# Patient Record
Sex: Female | Born: 1975 | Race: White | Hispanic: No | Marital: Married | Smoking: Light tobacco smoker
Health system: Southern US, Community
[De-identification: ages and names within clinical notes are randomized; demographics above are authoritative.]

## PROBLEM LIST (undated history)

## (undated) DIAGNOSIS — G4730 Sleep apnea, unspecified: Secondary | ICD-10-CM

## (undated) DIAGNOSIS — F329 Major depressive disorder, single episode, unspecified: Secondary | ICD-10-CM

## (undated) DIAGNOSIS — F419 Anxiety disorder, unspecified: Secondary | ICD-10-CM

## (undated) DIAGNOSIS — N979 Female infertility, unspecified: Secondary | ICD-10-CM

## (undated) DIAGNOSIS — N939 Abnormal uterine and vaginal bleeding, unspecified: Secondary | ICD-10-CM

## (undated) DIAGNOSIS — F32A Depression, unspecified: Secondary | ICD-10-CM

## (undated) HISTORY — DX: Female infertility, unspecified: N97.9

## (undated) HISTORY — PX: CARPAL TUNNEL RELEASE: SHX101

## (undated) HISTORY — DX: Depression, unspecified: F32.A

## (undated) HISTORY — PX: CHOLECYSTECTOMY: SHX55

## (undated) HISTORY — DX: Sleep apnea, unspecified: G47.30

## (undated) HISTORY — DX: Major depressive disorder, single episode, unspecified: F32.9

## (undated) HISTORY — DX: Abnormal uterine and vaginal bleeding, unspecified: N93.9

## (undated) HISTORY — DX: Anxiety disorder, unspecified: F41.9

---

## 2004-08-24 HISTORY — PX: CERVICAL DISC SURGERY: SHX588

## 2004-08-24 HISTORY — PX: LUMBAR DISC SURGERY: SHX700

## 2016-01-22 ENCOUNTER — Ambulatory Visit: Payer: Self-pay | Admitting: Obstetrics and Gynecology

## 2016-01-23 ENCOUNTER — Ambulatory Visit: Payer: Self-pay | Admitting: Obstetrics and Gynecology

## 2016-02-06 ENCOUNTER — Encounter: Payer: Self-pay | Admitting: Obstetrics and Gynecology

## 2016-02-06 ENCOUNTER — Ambulatory Visit (INDEPENDENT_AMBULATORY_CARE_PROVIDER_SITE_OTHER): Payer: BLUE CROSS/BLUE SHIELD | Admitting: Obstetrics and Gynecology

## 2016-02-06 VITALS — BP 130/72 | HR 60 | Resp 14 | Ht 66.5 in | Wt 345.0 lb

## 2016-02-06 DIAGNOSIS — N920 Excessive and frequent menstruation with regular cycle: Secondary | ICD-10-CM | POA: Diagnosis not present

## 2016-02-06 DIAGNOSIS — Z124 Encounter for screening for malignant neoplasm of cervix: Secondary | ICD-10-CM | POA: Diagnosis not present

## 2016-02-06 DIAGNOSIS — N926 Irregular menstruation, unspecified: Secondary | ICD-10-CM

## 2016-02-06 LAB — TSH: TSH: 1.41 m[IU]/L

## 2016-02-06 LAB — CBC
HCT: 41.4 % (ref 35.0–45.0)
HEMOGLOBIN: 13.8 g/dL (ref 11.7–15.5)
MCH: 29.5 pg (ref 27.0–33.0)
MCHC: 33.3 g/dL (ref 32.0–36.0)
MCV: 88.5 fL (ref 80.0–100.0)
MPV: 9.7 fL (ref 7.5–12.5)
Platelets: 192 10*3/uL (ref 140–400)
RBC: 4.68 MIL/uL (ref 3.80–5.10)
RDW: 14 % (ref 11.0–15.0)
WBC: 10 10*3/uL (ref 3.8–10.8)

## 2016-02-06 NOTE — Progress Notes (Signed)
Patient ID: Leodis SiasRhonda Reasner, female   DOB: 1976/03/19, 40 y.o.   MRN: [id number] GYNECOLOGY  VISIT  Ref: Samuel Jesterynthia Butler, MD   HPI: 40 y.o.   Married  Caucasian  female   G0P0000 with Patient's last menstrual period was 01/02/2016 (exact date).   here for abnormal uterine bleeding. Patient states has never had regular menses.  She went 9 years without a cycle from age [age].  Usually had 1-2 cycles per year. The past 2 years has had fairly regular cycles which have been heavy and passes large "half dollar" size clots.  Patient states cycle last month extremely heavy and passed large clots.  Mother present in the room for the entire visit.   Menses occur every month.  Lasted 7 - 14 days.  Unable to use tampons easily due to rods in her back.  States someone else has to place them for her. Clots go around the tampons also. Significant cramping.   Given Premarin orally to help reduce the bleeding.  Reports that this is too expensive for her.   Losing hair.   Not sexually active due to pain.   Had chronic back pain.  Smokes marijuana for muscle spasms and relief.  Smoker - 5 cig per day.   States a neighbor is stalking her.   GYNECOLOGIC HISTORY: Patient's last menstrual period was 01/02/2016 (exact date). Contraception:  none Menopausal hormone therapy:  n/a Last mammogram:  NEVER Last pap smear:   2013 normal per patient        OB History    Gravida Para Term Preterm AB TAB SAB Ectopic Multiple Living   0 0 0 0 0 0 0 0 0 0          There are no active problems to display for this patient.   Past Medical History  Diagnosis Date  . Abnormal uterine bleeding   . Anxiety   . Depression   . Infertility, female     Past Surgical History  Procedure Laterality Date  . Cholecystectomy    . Cervical disc surgery  2006    --3 broken discs--[facility]  . Lumbar disc surgery  2006  . Carpal tunnel release Right     Current Outpatient Prescriptions  Medication Sig Dispense  Refill  . ALPRAZolam (XANAX) 1 MG tablet Take 1 tablet by mouth 4 (four) times daily.  2  . fentaNYL (DURAGESIC - DOSED MCG/HR) 75 MCG/HR Place 1 patch onto the skin every 3 (three) days.  0  . gabapentin (NEURONTIN) 300 MG capsule Take 1 capsule by mouth 3 (three) times daily.  10  . HYDROmorphone (DILAUDID) 8 MG tablet Takes 1-2 tablets at bedtime  0  . oxycodone (ROXICODONE) 30 MG immediate release tablet Take 1 tablet by mouth every 4 (four) hours.    Marland Kitchen. PREMARIN 1.25 MG tablet Take 1 tablet by mouth daily.  10  . promethazine (PHENERGAN) 25 MG tablet Take 1 tablet by mouth 3 (three) times daily.  3  . sertraline (ZOLOFT) 100 MG tablet Take 1 tablet by mouth daily.  10   No current facility-administered medications for this visit.     ALLERGIES: Amoxicillin  Family History  Problem Relation Age of Onset  . Thyroid disease Mother     Social History   Social History  . Marital Status: Married    Spouse Name: N/A  . Number of Children: N/A  . Years of Education: N/A   Occupational History  . Not on file.  Social History Main Topics  . Smoking status: Light Tobacco Smoker    Types: Cigarettes  . Smokeless tobacco: Not on file     Comment: smokes 5 cigarettes/day  . Alcohol Use: No  . Drug Use: Yes    Special: Marijuana     Comment: nightly  . Sexual Activity:    Partners: Male    Pharmacist, hospital Protection: None   Other Topics Concern  . Not on file   Social History Narrative  . No narrative on file    ROS:  Pertinent items are noted in HPI.  PHYSICAL EXAMINATION:    BP 130/72 mmHg  Pulse 60  Resp 14  Ht 5' 6.5" (1.689 m)  Wt 345 lb (156.491 kg)  BMI 54.86 kg/m2  LMP 01/02/2016 (Exact Date)    General appearance: alert, cooperative and appears stated age.  Emotional and crying at times during the visit.  Difficulty using the exam table. Head: Normocephalic, without obvious abnormality, atraumatic Neck: no adenopathy, supple, symmetrical, trachea midline  and thyroid normal to inspection and palpation Lungs: clear to auscultation bilaterally Breasts: normal appearance, no masses or tenderness, Inspection negative, No nipple retraction or dimpling, No nipple discharge or bleeding, No axillary or supraclavicular adenopathy Heart: regular rate and rhythm Abdomen: obese, soft, non-tender, no masses,  no organomegaly Extremities: extremities normal, atraumatic, no cyanosis or edema Skin: Skin color, texture, turgor normal. No rashes or lesions Lymph nodes: Cervical, supraclavicular, and axillary nodes normal. No abnormal inguinal nodes palpated Neurologic: Grossly normal  Pelvic: External genitalia:  no lesions              Urethra:  normal appearing urethra with no masses, tenderness or lesions              Bartholins and Skenes: normal                 Vagina: normal appearing vagina with normal color and discharge, no lesions              Cervix: no lesions and no bleeding.              Pap taken: Yes.   Bimanual Exam:  Uterus:  normal size, contour, position, consistency, mobility, non-tender.  Exam limited by body habitus.               Adnexa: normal adnexa and no mass, fullness, tenderness              Rectal exam: Yes.  .  Confirms.              Anus:  normal sphincter tone, no lesions  Chaperone was present for exam.  ASSESSMENT  Menorrhagia with irregular menses.  Unopposed estrogen.  Recent treatment. Not using contraception.  Not sexually active. Morbid obesity.  Tobacco and marijuana use. Status post cervical and lumbar spine surgery.  Chronic pain.  Limited mobility. Depression and anxiety.  PLAN  Discussion of abnormal uterine bleeding as possible etiologies.  Anovulation.  Polyps. Fibroids. Hyperplasia.  Cancer. Pap and HR HPV taken.  Check TSH, CBC, and iron level.  Will have patient return for transvaginal ultraound and EMB. I discussed possible tx options for the bleeding including Micronor and Mirena IUD. I told  her I do not plan for her to continue with the unopposed estrogen therapy.  Needs mammogram.  [facility]  An After Visit Summary was printed and given to the patient.  __45____  minutes face to face time of which over 50% was spent in counseling.

## 2016-02-07 ENCOUNTER — Telehealth: Payer: Self-pay | Admitting: Obstetrics and Gynecology

## 2016-02-07 LAB — IRON: IRON: 63 ug/dL (ref 40–190)

## 2016-02-07 NOTE — Telephone Encounter (Signed)
Patient is returning a call to [name]. Patient asked that you call her home call [phone] and leave a message.  Patient does not get good phone reception, if you leave a message she will call you right back.

## 2016-02-07 NOTE — Telephone Encounter (Signed)
Left detailed message at number provided [phone], okay per ROI. Advised I have reviewed her medication list and verified this with up to date drug interaction. It is safe for her to take Ibuprofen 600-800 mg 1 hours before her appointment. This is will cause a drug interaction with the medications she is on. Advised she will need to take this with food. Return call with any further questions.  Routing to provider for final review. Patient agreeable to disposition. Will close encounter.

## 2016-02-07 NOTE — Telephone Encounter (Signed)
Patient returned call. Spoke with patient at length regarding all benefits/concerns. Agreement reached.   Patient requested instructions for procedures. Notified of need to hydrate and eat. Patient understood and agreeable. Patient requested information on ability to take ibuprofen based on her personal medication list. Patient requests call from nurse and authorizes detailed message left on her phone due to poor reception in her area. [phone].

## 2016-02-07 NOTE — Telephone Encounter (Signed)
Call patient to discuss benefits for a procedure. Left message requesting a return call.

## 2016-02-11 LAB — IPS PAP TEST WITH HPV

## 2016-02-13 ENCOUNTER — Ambulatory Visit (INDEPENDENT_AMBULATORY_CARE_PROVIDER_SITE_OTHER): Payer: BLUE CROSS/BLUE SHIELD | Admitting: Obstetrics and Gynecology

## 2016-02-13 ENCOUNTER — Ambulatory Visit (INDEPENDENT_AMBULATORY_CARE_PROVIDER_SITE_OTHER): Payer: BLUE CROSS/BLUE SHIELD

## 2016-02-13 ENCOUNTER — Encounter: Payer: Self-pay | Admitting: Obstetrics and Gynecology

## 2016-02-13 DIAGNOSIS — N921 Excessive and frequent menstruation with irregular cycle: Secondary | ICD-10-CM | POA: Diagnosis not present

## 2016-02-13 DIAGNOSIS — N926 Irregular menstruation, unspecified: Secondary | ICD-10-CM

## 2016-02-13 DIAGNOSIS — N920 Excessive and frequent menstruation with regular cycle: Secondary | ICD-10-CM

## 2016-02-13 NOTE — Progress Notes (Signed)
GYNECOLOGY  VISIT   HPI: 40 y.o.   Married  Caucasian  female   G0P0000 with Patient's last menstrual period was 01/02/2016 (exact date).   here for   Menorrhagia.  Mother is present for the entire visit today.  States she really wants her vaginal bleeding to stop.  She states she is making a significant investment in her care, and she needs to bleeding to stop.  She needs to be able to focus on her back and potential surgical care for her back and pain.   She stopped taking the oral Premarin after her visit with me on 5/1/1/7.  GYNECOLOGIC HISTORY: Patient's last menstrual period was 01/02/2016 (exact date). Contraception:  None.  Last intercourse was Dec 29, 2015.   Menopausal hormone therapy:  NA Last mammogram:   Never. Last pap smear:   02/07/16 - negative and negative HR HPV.        OB History    Gravida Para Term Preterm AB TAB SAB Ectopic Multiple Living   0 0 0 0 0 0 0 0 0 0          There are no active problems to display for this patient.   Past Medical History  Diagnosis Date  . Abnormal uterine bleeding   . Anxiety   . Depression   . Infertility, female     Past Surgical History  Procedure Laterality Date  . Cholecystectomy    . Cervical disc surgery  2006    --3 broken discs--[facility]  . Lumbar disc surgery  2006  . Carpal tunnel release Right     Current Outpatient Prescriptions  Medication Sig Dispense Refill  . ALPRAZolam (XANAX) 1 MG tablet Take 1 tablet by mouth 4 (four) times daily.  2  . fentaNYL (DURAGESIC - DOSED MCG/HR) 75 MCG/HR Place 1 patch onto the skin every 3 (three) days.  0  . gabapentin (NEURONTIN) 300 MG capsule Take 1 capsule by mouth 3 (three) times daily.  10  . HYDROmorphone (DILAUDID) 8 MG tablet Takes 1-2 tablets at bedtime  0  . oxycodone (ROXICODONE) 30 MG immediate release tablet Take 1 tablet by mouth every 4 (four) hours.    Marland Kitchen. PREMARIN 1.25 MG tablet Take 1 tablet by mouth daily.  10  . promethazine (PHENERGAN) 25 MG  tablet Take 1 tablet by mouth 3 (three) times daily.  3  . sertraline (ZOLOFT) 100 MG tablet Take 1 tablet by mouth daily.  10   No current facility-administered medications for this visit.     ALLERGIES: Amoxicillin  Family History  Problem Relation Age of Onset  . Thyroid disease Mother     Social History   Social History  . Marital Status: Married    Spouse Name: N/A  . Number of Children: N/A  . Years of Education: N/A   Occupational History  . Not on file.   Social History Main Topics  . Smoking status: Light Tobacco Smoker    Types: Cigarettes  . Smokeless tobacco: Not on file     Comment: smokes 5 cigarettes/day  . Alcohol Use: No  . Drug Use: Yes    Special: Marijuana     Comment: nightly  . Sexual Activity:    Partners: Male    Pharmacist, hospitalBirth Control/ Protection: None   Other Topics Concern  . Not on file   Social History Narrative    ROS:  Pertinent items are noted in HPI.  PHYSICAL EXAMINATION:    BP 110/74 mmHg  Pulse 64  Ht 5' 6.5" (1.689 m)  Wt 355 lb (161.027 kg)  BMI 56.45 kg/m2  LMP 01/02/2016 (Exact Date)    General appearance: alert, cooperative and appears stated age  Procedure - endometrial biopsy Consent performed. Speculum place in vagina.  Sterile prep of cervix with Hibiclens. Tenaculum to anterior cervical lip. Paracervical block with 10 cc 1% lidocaine _______ yes___.  Lot [id number], expiration 10/22/16. Pipelle placed to     9    cm without difficulty twice. Tissue obtained and sent to pathology. Speculum removed.  No complications. Minimal EBL.  Chaperone was present for exam.  ASSESSMENT  Menorrhagia with irregular menses.  Unopposed estrogen. Recent treatment.  Morbid obesity.  Tobacco and marijuana use. Status post cervical and lumbar spine surgery. Chronic pain. Limited mobility. Depression and anxiety.  PLAN  Discussion of abnormal uterine bleeding.  Follow up EMB. Precautions given.  We spent a significant  amount of time reviewing options to care, and I focused on the Mirena IUD.  We reviewed risks and benefits in detail.  Brochure provided. She understands that with the Mirena she may have irregular cycles for several months, but that she is likely to have very light menses or possible amenorrhea with this option. Micronor is an alternative as well.  Final plan to follow after EMB is back.   An After Visit Summary was printed and given to the patient.  __25____ minutes face to face time of which over 50% was spent in counseling.

## 2016-02-13 NOTE — Patient Instructions (Signed)

## 2016-02-17 LAB — IPS OTHER TISSUE BIOPSY

## 2016-02-27 ENCOUNTER — Telehealth: Payer: Self-pay | Admitting: *Deleted

## 2016-02-27 NOTE — Telephone Encounter (Signed)
Patient returned call. Reviewed results and options as directed by Dr Edward JollySilva.  Patient insistent that she wants bleeding to stop. Concerned she will continue to have bleeding with Mirena. Advised she can have consult with Dr Adolphus BirchwoodEmma Rossi to discuss possible hysterectomy. Patient states she would like to consider this but for now, will plan to proceed with hysteroscopy/Myosure/D&C/Mirena insertion. Patient also request phone call to mother Erskine Squibbjane, to review these results.  Call to mother, left message to call back.

## 2016-02-27 NOTE — Telephone Encounter (Signed)
Call to patient, left message to call back. 

## 2016-02-27 NOTE — Telephone Encounter (Signed)
Return call from patient's mother. Results and options reviewed with mother. Expressed concerns regarding IUD. Both patients mother and sister had difficulty with IUD. Clearly concerned about patient having IUD. Discussed improvements and smaller size. Advised mother that can have hysteroscopy/D&C without insertion of IUD.  IUD is the option to keep lining suppressed long term but if against IUD, could see how cycles improve after removal of polyp. Mother states she feels like patient's bleeding is exceptionally heavy and she really needs something to stop it. They will discuss options and call us back by next week. Request we check benefits for procedure in case they decide to proceed.  Routing to provider for final review.  Will close encounter.

## 2016-02-27 NOTE — Telephone Encounter (Signed)
-----   Message from Patton SallesBrook E Amundson C Silva, MD sent at 02/26/2016  2:15 PM EDT ----- Please contact patient regarding EMB results which show possible polyp. No hyperplasia or malignancy were seen.  Options for care are: -  a sonohysterogram in office to determine if a polyp is there.  If so, she will need a hysteroscopy with polypectomy and dilation and curettage. Mirena IUD may be possibly placed at the same time.  If no polyp is seen with the sonohysterogram, she will have the Mirena IUD placed at the same visit. (US guidance.) -  go directly to a hysteroscopic polypectomy with dilation and curettage.  Mirena IUD may be possibly placed at the same time.  Cc- Claudette LawsAmanda Dixon

## 2016-03-02 ENCOUNTER — Telehealth: Payer: Self-pay | Admitting: Obstetrics and Gynecology

## 2016-03-02 NOTE — Telephone Encounter (Signed)
Called patient to review benefits for a recommended procedure. Left Voicemail requesting a call back. °

## 2016-03-10 NOTE — Telephone Encounter (Signed)
Patient called and said, "Please tell Joanna Evans I am very sorry I had an attitude with her earlier. I am very overwhelmed with a lot of things right now and I am truly sorry. That is not me. Please call me back or my mom."

## 2016-03-20 ENCOUNTER — Telehealth: Payer: Self-pay | Admitting: *Deleted

## 2016-03-20 NOTE — Telephone Encounter (Signed)
Call to patient regarding scheduling of procedure. Left message to call back.

## 2016-03-23 NOTE — Telephone Encounter (Signed)
Patient returning Joanna Evans's call. She said if you could not reach her on her cell phone that you could call her mom [name]) at [phone]. She states that "you can give my mom any information regarding me and my medical records/info".

## 2016-03-23 NOTE — Telephone Encounter (Signed)
Call to patient, left message to call back regarding surgery dates. Left message that 8-22 and 04-07-16 are options.

## 2016-03-25 NOTE — Telephone Encounter (Signed)
Return call from patient. Desires to proceed with 04-14-16 surgery date. Will schedule and call her back.

## 2016-03-26 NOTE — Telephone Encounter (Signed)
Left patient message to return call regarding her surgery date and time. She also need to be scheduled for pre and post opt appts with Dr. Edward Jolly.

## 2016-03-26 NOTE — Telephone Encounter (Signed)
Spoke with patient and went over pre surgery information. She is scheduled for pre and post surgery visits with Dr. Edward Jolly. Patient voiced understanding. A copy of the pre opt information was provided for the patient. -eh

## 2016-03-30 ENCOUNTER — Telehealth: Payer: Self-pay | Admitting: Obstetrics and Gynecology

## 2016-03-30 NOTE — Telephone Encounter (Signed)
Surgery consult rescheduled to Monday 04-06-16 at 2:30pm. Left message on voice mail with appointment information per patient instructions.   Routing to provider for final review. Patient agreeable to disposition. Will close encounter.

## 2016-03-30 NOTE — Telephone Encounter (Signed)
Patient called and cancelled her pre-op appointment for 04/01/16. She said, "My monthly came on and I can't get ten minutes away from the house. I'd like to reschedule to sometime next week after 12:00 PM. Kennon RoundsSally can just call and leave the new appointment details on my voicemail. The reception at my house is not good so a message is best."

## 2016-04-01 ENCOUNTER — Ambulatory Visit: Payer: Self-pay | Admitting: Obstetrics and Gynecology

## 2016-04-02 ENCOUNTER — Other Ambulatory Visit: Payer: Self-pay | Admitting: *Deleted

## 2016-04-02 DIAGNOSIS — M5030 Other cervical disc degeneration, unspecified cervical region: Secondary | ICD-10-CM

## 2016-04-02 DIAGNOSIS — M5136 Other intervertebral disc degeneration, lumbar region: Secondary | ICD-10-CM

## 2016-04-06 ENCOUNTER — Encounter: Payer: Self-pay | Admitting: Obstetrics and Gynecology

## 2016-04-06 ENCOUNTER — Ambulatory Visit (INDEPENDENT_AMBULATORY_CARE_PROVIDER_SITE_OTHER): Payer: BLUE CROSS/BLUE SHIELD | Admitting: Obstetrics and Gynecology

## 2016-04-06 VITALS — BP 110/72 | HR 70 | Ht 66.5 in | Wt 329.0 lb

## 2016-04-06 DIAGNOSIS — N920 Excessive and frequent menstruation with regular cycle: Secondary | ICD-10-CM | POA: Diagnosis not present

## 2016-04-06 NOTE — Progress Notes (Signed)
GYNECOLOGY  VISIT   HPI: 40 y.o.   Married  Caucasian  female   G0P0000 with Patient's last menstrual period was 04/01/2016 (exact date).   here for surgical consult for hysteroscopy with polypectomy and dilation and curettage. She is asking about hysterectomy for definitive treatment of her heavy menses.  Just wants the bleeding to all stop.   Heavy menses.  Unable to place tampons due to mobility issues with her back.  Bleeding prevents sexual functioning.   Pelvic ultrasound 02/13/16: Uterus normal shape and size.  No fibroids.  Uterine length is 7.65 cm. Width 3.65 cm. EMS 6.38 mm.  Right ovary not seen.  Left ovary normal.  No free fluid.   EMB showed benign polypoid endometrium.   Hgb 13.8 on 02/06/16.   Patient has continued unopposed Premarin orally which was prescribed by her PCP.  Potentially interested in Mirena IUD.    GYNECOLOGIC HISTORY: Patient's last menstrual period was 04/01/2016 (exact date). Contraception:  none Menopausal hormone therapy:  n/a Last mammogram:  never Last pap smear:   02-07-16 Neg:Neg HR HPV        OB History    Gravida Para Term Preterm AB Living   0 0 0 0 0 0   SAB TAB Ectopic Multiple Live Births   0 0 0 0           There are no active problems to display for this patient.   Past Medical History:  Diagnosis Date  . Abnormal uterine bleeding   . Anxiety   . Depression   . Infertility, female   . Sleep apnea    sleeps with a CPAP     Past Surgical History:  Procedure Laterality Date  . CARPAL TUNNEL RELEASE Right   . CERVICAL DISC SURGERY  2006   --3 broken discs--[facility]  . CHOLECYSTECTOMY    . LUMBAR DISC SURGERY  2006    Current Outpatient Prescriptions  Medication Sig Dispense Refill  . ALPRAZolam (XANAX) 1 MG tablet Take 1 tablet by mouth 4 (four) times daily.  2  . fentaNYL (DURAGESIC - DOSED MCG/HR) 75 MCG/HR Place 1 patch onto the skin every 3 (three) days.  0  . gabapentin (NEURONTIN) 300 MG capsule  Take 1 capsule by mouth 3 (three) times daily.  10  . HYDROmorphone (DILAUDID) 8 MG tablet Take 1-2 mg by mouth at bedtime as needed for severe pain.   0  . promethazine (PHENERGAN) 25 MG tablet Take 1-2 tablets by mouth every 6 (six) hours as needed for nausea.   3  . sertraline (ZOLOFT) 100 MG tablet Take 1 tablet by mouth daily.  10  . Biotin 5000 MCG CAPS Take 1 capsule by mouth 2 (two) times daily.    . diphenhydrAMINE (BENADRYL) 25 MG tablet Take 50 mg by mouth every 6 (six) hours as needed for allergies.    Marland Kitchen. docusate sodium (COLACE) 100 MG capsule Take 100 mg by mouth daily as needed for mild constipation.    Marland Kitchen. Lysine 500 MG CAPS Take 1 capsule by mouth 2 (two) times daily.    . Multiple Vitamin (MULTIVITAMIN WITH MINERALS) TABS tablet Take 1 tablet by mouth daily.    Marland Kitchen. oxycodone (ROXICODONE) 30 MG immediate release tablet Take 1 tablet by mouth every 4 (four) hours as needed for pain.     Marland Kitchen. PREMARIN 1.25 MG tablet Take 1 tablet by mouth daily.  10   No current facility-administered medications for this visit.  ALLERGIES: Amoxicillin  Family History  Problem Relation Age of Onset  . Thyroid disease Mother     Social History   Social History  . Marital status: Married    Spouse name: N/A  . Number of children: N/A  . Years of education: N/A   Occupational History  . Not on file.   Social History Main Topics  . Smoking status: Light Tobacco Smoker    Types: Cigarettes  . Smokeless tobacco: Not on file     Comment: smokes 5 cigarettes/day  . Alcohol use No  . Drug use:     Types: Marijuana     Comment: nightly  . Sexual activity: Yes    Partners: Male    Birth control/ protection: None   Other Topics Concern  . Not on file   Social History Narrative  . No narrative on file    ROS:  Pertinent items are noted in HPI.  PHYSICAL EXAMINATION:    BP 110/72 (BP Location: Right Arm, Patient Position: Sitting, Cuff Size: Large)   Pulse 70   Ht 5' 6.5" (1.689  m)   Wt (!) 329 lb (149.2 kg)   LMP 04/01/2016 (Exact Date)   BMI 52.31 kg/m     General appearance: alert, cooperative and appears stated age Head: Normocephalic, without obvious abnormality, atraumatic Neck: no adenopathy, supple, symmetrical, trachea midline and thyroid normal to inspection and palpation Lungs: clear to auscultation bilaterally Heart: regular rate and rhythm Abdomen: obese, soft, non-tender, no masses,  no organomegaly Extremities: extremities normal, atraumatic, no cyanosis or edema Skin: Skin color, texture, turgor normal. No rashes or lesions No abnormal inguinal nodes palpated   Pelvic: External genitalia:  no lesions              Urethra:  normal appearing urethra with no masses, tenderness or lesions              Bartholins and Skenes: normal                 Vagina: normal appearing vagina with normal color and discharge, no lesions              Cervix: not able to visualize well with extra long speculum.                Bimanual Exam:  Uterus:  non-tender.                Adnexa: no mass, fullness, tenderness              Bimanual exam limited by body habitus.           Chaperone was present for exam.  ASSESSMENT  Menorrhagia with irregular menses.  Benign polypoid endometrium on EMB. Unopposed estrogen.  Morbid obesity.  Tobacco and marijuana use. Status post cervical and lumbar spine surgery. Chronic pain. Limited mobility. Depression and anxiety.   PLAN  I discussed hysteroscopy with polypectomy and dilation and curettage for immediate care. Patient's goal is to have all her bleeding stop, and this will not meet this treatment goal.  I believe that IUD insertion may be quite difficult in an office setting due to her BMI and we are having difficulty getting approval of an IUD to be placed in the OR setting. She declines Depo Provera or a daily medication to control bleeding.  I have again recommended she stop taking the unopposed oral  estrogens.   I am referring the patient to GYN [facility] for evaluation and  treatment.  I have indicated to her that she would need a surgical subspecialist for hysterectomy if this is what is ultimately chosen.    An After Visit Summary was printed and given to the patient.  ___25___ minutes face to face time of which over 50% was spent in counseling.

## 2016-04-08 ENCOUNTER — Other Ambulatory Visit (HOSPITAL_COMMUNITY): Payer: Self-pay

## 2016-04-09 ENCOUNTER — Encounter: Payer: Self-pay | Admitting: Obstetrics and Gynecology

## 2016-04-14 ENCOUNTER — Encounter: Admission: RE | Payer: Self-pay | Source: Ambulatory Visit

## 2016-04-14 ENCOUNTER — Ambulatory Visit: Admission: RE | Admit: 2016-04-14 | Payer: Self-pay | Source: Ambulatory Visit | Admitting: Obstetrics and Gynecology

## 2016-04-14 SURGERY — DILATATION & CURETTAGE/HYSTEROSCOPY WITH MYOSURE
Anesthesia: Choice

## 2016-04-22 ENCOUNTER — Ambulatory Visit: Payer: BLUE CROSS/BLUE SHIELD | Attending: Gynecologic Oncology | Admitting: Gynecologic Oncology

## 2016-04-22 ENCOUNTER — Encounter: Payer: Self-pay | Admitting: Gynecologic Oncology

## 2016-04-22 DIAGNOSIS — Z8349 Family history of other endocrine, nutritional and metabolic diseases: Secondary | ICD-10-CM | POA: Diagnosis not present

## 2016-04-22 DIAGNOSIS — F419 Anxiety disorder, unspecified: Secondary | ICD-10-CM | POA: Diagnosis not present

## 2016-04-22 DIAGNOSIS — F32A Depression, unspecified: Secondary | ICD-10-CM | POA: Insufficient documentation

## 2016-04-22 DIAGNOSIS — F329 Major depressive disorder, single episode, unspecified: Secondary | ICD-10-CM

## 2016-04-22 DIAGNOSIS — Z9049 Acquired absence of other specified parts of digestive tract: Secondary | ICD-10-CM | POA: Diagnosis not present

## 2016-04-22 DIAGNOSIS — F17210 Nicotine dependence, cigarettes, uncomplicated: Secondary | ICD-10-CM | POA: Diagnosis not present

## 2016-04-22 DIAGNOSIS — N939 Abnormal uterine and vaginal bleeding, unspecified: Secondary | ICD-10-CM

## 2016-04-22 DIAGNOSIS — Z880 Allergy status to penicillin: Secondary | ICD-10-CM | POA: Diagnosis not present

## 2016-04-22 DIAGNOSIS — Z6841 Body Mass Index (BMI) 40.0 and over, adult: Secondary | ICD-10-CM | POA: Insufficient documentation

## 2016-04-22 DIAGNOSIS — M1990 Unspecified osteoarthritis, unspecified site: Secondary | ICD-10-CM | POA: Diagnosis not present

## 2016-04-22 DIAGNOSIS — N979 Female infertility, unspecified: Secondary | ICD-10-CM | POA: Diagnosis not present

## 2016-04-22 DIAGNOSIS — G4730 Sleep apnea, unspecified: Secondary | ICD-10-CM | POA: Diagnosis not present

## 2016-04-22 MED ORDER — LEVONORGESTREL 20 MCG/24HR IU IUD: 1.0000 | INTRAUTERINE_SYSTEM | Freq: Once | INTRAUTERINE | 0 refills | Status: AC

## 2016-04-22 NOTE — Progress Notes (Signed)
Consult Note: Gyn-Onc  Joanna Evans 40 y.o. female  CC: No chief complaint on file.   HPI: Patient is seen today in consultation at the request of Dr. Edward Jolly.   Patient is a 40 year old gravida 0 para 0 who is referred to [place] by Dr. Edward Jolly secondary to heavy bleeding. She was seen for a surgical consultation by Dr. Edward Jolly August 14 for discussion of hysteroscopy with polypectomy, D&C and even hysterectomy. At that visit was recommended that she stop taking her unopposed estrogen and that could be consideration regarding attempting medical management for her bleeding. Discussion was had regarding placement of a Mirena IUD. However, insurance coverage for this was limited and she is referred to GYN oncology.  The patient states she has very heavy periods and she is unable placed tampon secondary to mobility issues from her back. Her mother has to place her tampons for her. She states that the heavy bleeding is interfering with her quality of life. She did have a pelvic ultrasound on 02/13/2016 that revealed no fibroids. The uterus is 7.16 cm with a width of 3.65 cm. The endometrial stripe thickness was 6.38. The adnexa were not seen and there is no free fluid. She did have an endometrial biopsy which Dr. Edward Jolly performed that showed a benign polypoid endometrium. The patient has been taking unopposed estrogen prescribed by her primary care physician which was initially given to her for her menorrhagia in an appropriate taper fashion but she continued it. Her hemoglobin with Dr. Edward Jolly on June 15 was 13.8.  Her last Pap smear was 02/07/2016. It was negative with negative high risk HPV. She's never had a mammogram.  -She quite adamantly wishes to proceed with the hysterectomy by discussed with her that she has not even had the opportunity to have medical management and that I would not perform a hysterectomy nor would I recommended in this setting. The conversation was rather disjointed today as she would  talk about a variety of issues. She continues to have significant issues with her back and she attributes this to a procedure that was done incorrectly. She believes she needs to have another back surgery secondary to numbness in her right leg and burning in her left leg. She has pain in her bilateral hips. She did stop the estrogen therapy. She does use marijuana secondary to her pain. The pain for which she uses marijuana on a nightly basis is primarily back spasms. She has never had a mammogram.  -She spoke about the amount of stress she has in her life and this is causing her to have increased bleeding. Due to the stress she experiences hair loss. She is taking a biotin supplement her hair loss is improved. She's had thyroid function testing was normal. This was done to evaluate both for hair loss and her menorrhagia.  -She states that her weight is down from 355 pounds. She states that she has a poor appetite and is losing weight.  -She is very concerned about being labeled a drug abuser and drug user secondary to medication she is on. She states that all of the medication she is on are required for her back pain.  -She is concerned about the number of cancers in her family. Should a maternal uncle who developed prostate cancer in his 21s. She maternal uncle who had colon cancer in his 19s. She has 1 paternal aunt developed breast cancer. She was 32. 3 family members have abdominal aortic aneurysms. Her father has an abdominal aortic  aneurysm as well as coronary disease. He is next smoker. She's a paternal aunt with an abdominal aortic aneurysm as well as a maternal uncle. There is significant diabetes in her family. She states that she is not a diabetic based on recent blood work.  -She is concerned about having the IUD placed as it "did not work" for family members. She states that her sister had an IUD was removed she got pregnant. I explained to the patient that is precisely what the IUD was meant  to do to provide contraception once it was removed for sister conceived. She also states that the IUD did not work for her mother but she cannot elaborate on this and her mother did not have any comment regarding this. The patient is concerned that after 5 years however the IUD be removed. It discussed with her that I can be removed in the office. She is concerned that if her husband does not have any insurance and 5 years with acute regarding a hysterectomy. Discuss with her that I cannot think 5 years in the future. I would not recommend hysterectomy at this time in the absence of any attempt at medical management. She has a normal-size uterus with a normal cavity and no evidence of hyperplasia or carcinoma. She is worried about the IUD being removed and dislodged as either she or her mother of removing her tampons. I discussed with her that that is typically not an issue.  Review of Systems: Notable for those issues as above. Primarily complaining of significant pain and numbness and her bilateral lower extremities. Menorrhagia, and multiple stressors which have led to hair loss and decreased appetite.  Current Meds:  Outpatient Encounter Prescriptions as of 04/22/2016  Medication Sig  . ALPRAZolam (XANAX) 1 MG tablet Take 1 tablet by mouth 4 (four) times daily.  . Biotin 5000 MCG CAPS Take 1 capsule by mouth 2 (two) times daily.  . diphenhydrAMINE (BENADRYL) 25 MG tablet Take 50 mg by mouth every 6 (six) hours as needed for allergies.  Marland Kitchen. docusate sodium (COLACE) 100 MG capsule Take 100 mg by mouth daily as needed for mild constipation.  . fentaNYL (DURAGESIC - DOSED MCG/HR) 75 MCG/HR Place 1 patch onto the skin every 3 (three) days.  Marland Kitchen. gabapentin (NEURONTIN) 300 MG capsule Take 1 capsule by mouth 3 (three) times daily.  Marland Kitchen. HYDROmorphone (DILAUDID) 8 MG tablet Take 1-2 mg by mouth at bedtime as needed for severe pain.   Marland Kitchen. Lysine 500 MG CAPS Take 1 capsule by mouth 2 (two) times daily.  . Multiple  Vitamin (MULTIVITAMIN WITH MINERALS) TABS tablet Take 1 tablet by mouth daily.  Marland Kitchen. oxycodone (ROXICODONE) 30 MG immediate release tablet Take 1 tablet by mouth every 4 (four) hours as needed for pain.   Marland Kitchen. PREMARIN 1.25 MG tablet Take 1 tablet by mouth daily.  . promethazine (PHENERGAN) 25 MG tablet Take 1-2 tablets by mouth every 6 (six) hours as needed for nausea.   . sertraline (ZOLOFT) 100 MG tablet Take 1 tablet by mouth daily.   No facility-administered encounter medications on file as of 04/22/2016.     Allergy:  Allergies  Allergen Reactions  . Amoxicillin Other (See Comments)    "Thrush of mouth" Has patient had a PCN reaction causing immediate rash, facial/tongue/throat swelling, SOB or lightheadedness with hypotension: yes Has patient had a PCN reaction causing severe rash involving mucus membranes or skin necrosis: {no Has patient had a PCN reaction that required hospitalization no Has  patient had a PCN reaction occurring within the last 10 years: yes If all of the above answers are "NO", then may proceed with Cephalosporin use.     Social Hx:   Social History   Social History  . Marital status: Married    Spouse name: N/A  . Number of children: N/A  . Years of education: N/A   Occupational History  . Not on file.   Social History Main Topics  . Smoking status: Light Tobacco Smoker    Types: Cigarettes  . Smokeless tobacco: Not on file     Comment: smokes 5 cigarettes/day  . Alcohol use No  . Drug use:     Types: Marijuana     Comment: nightly  . Sexual activity: Yes    Partners: Male    Birth control/ protection: None   Other Topics Concern  . Not on file   Social History Narrative  . No narrative on file    Past Surgical Hx:  Past Surgical History:  Procedure Laterality Date  . CARPAL TUNNEL RELEASE Right   . CERVICAL DISC SURGERY  2006   --3 broken discs--[place]  . CHOLECYSTECTOMY    . LUMBAR DISC SURGERY  2006    Past Medical Hx:  Past  Medical History:  Diagnosis Date  . Abnormal uterine bleeding   . Anxiety   . Depression   . Infertility, female   . Sleep apnea    sleeps with a CPAP     Oncology Hx:   No history exists.    Family Hx:  Family History  Problem Relation Age of Onset  . Thyroid disease Mother     Vitals:  Last menstrual period 04/01/2016.  Physical Exam: Well-nourished well-developed female in no acute distress. Physical exam was deferred as she had recent normal ultrasound and exam with Dr. Edward Jolly.  Assessment/Plan: 40 year old with multiple medical problems mainly related to back it issues, chronic pain, arthritis and morbid obesity. The patient states she has menorrhagia although her last hemoglobin was well within the normal limits. It sounds like she has oligomenorrhea from her morbid obesity and then when she starts bleeding she has heavy bleeding for a period time with them ago long gaps without it.. Most recently her cycles of become more regular but they continue to be heavy with large clots. She states that secondary to her back issue she's not able to place her own tampons or manage her own hygiene and that's bothersome to her. She has to have her mother managing her hygiene for her.  I believe the most appropriate course of action would be to proceed with a D&C and IUD placement. The patient states that she was under the impression she had endometrial polyps. I cannot identify any evidence of that either on the ultrasound report I have or Dr. Rica Records note. If there are polyps to be removed at the time of D&C. I will be favor placing a Mirena IUD as I believe she is at risk for hyperplasia secondary to her morbid obesity. We will need to get the IUD covered by her insurance. Once we obtain coverage then we will call the patient to schedule the procedure in the operating room.  If her pathology does not reveal any evidence of carcinoma or hyperplasia, she can follow up with benign gynecology for  continued management her menorrhagia.  We appreciate the opportunity to partner in the care of this pleasant patient.  Greater than 40 minutes face to face time  was spent speak to the patient regarding her multiple concerns.  Zafar Debrosse A., MD 04/22/2016, 12:54 PM

## 2016-04-22 NOTE — Patient Instructions (Signed)
We will contact you insurance and see if the IUD can be picked up at a pharmacy or whether it has to be mailed.  Once we have this information, we will schedule you for a surgery hopefully next week.  We will hold time on September 7 with Dr. Laurette Schimke but this may change depending on when we can obtain the IUD.  Once you are scheduled, you will receive a phone call from the pre-surgical RN at the [facility] to discuss instructions.    Dilation and Curettage or Vacuum Curettage (D&C) Dilation and curettage (D&C) and vacuum curettage are minor procedures. A D&C involves stretching (dilation) the cervix and scraping (curettage) the inside lining of the womb (uterus). During a D&C, tissue is gently scraped from the inside lining of the uterus. During a vacuum curettage, the lining and tissue in the uterus are removed with the use of gentle suction.  Curettage may be performed to either diagnose or treat a problem. As a diagnostic procedure, curettage is performed to examine tissues from the uterus. A diagnostic curettage may be performed for the following symptoms:   Irregular bleeding in the uterus.   Bleeding with the development of clots.   Spotting between menstrual periods.   Prolonged menstrual periods.   Bleeding after menopause.   No menstrual period (amenorrhea).   A change in size and shape of the uterus.  As a treatment procedure, curettage may be performed for the following reasons:   Removal of an IUD (intrauterine device).   Removal of retained placenta after giving birth. Retained placenta can cause an infection or bleeding severe enough to require transfusions.   Abortion.   Miscarriage.   Removal of polyps inside the uterus.   Removal of uncommon types of noncancerous lumps (fibroids).  LET [facility] CARE PROVIDER KNOW ABOUT:   Any allergies you have.   All medicines you are taking, including vitamins, herbs, eye drops, creams, and  over-the-counter medicines.   Previous problems you or members of your family have had with the use of anesthetics.   Any blood disorders you have.   Previous surgeries you have had.   Medical conditions you have. RISKS AND COMPLICATIONS  Generally, this is a safe procedure. However, as with any procedure, complications can occur. Possible complications include:  Excessive bleeding.   Infection of the uterus.   Damage to the cervix.   Development of scar tissue (adhesions) inside the uterus, later causing abnormal amounts of menstrual bleeding.   Complications from the general anesthetic, if a general anesthetic is used.   Putting a hole (perforation) in the uterus. This is rare.  BEFORE THE PROCEDURE   Eat and drink before the procedure only as directed by your health care provider.   Arrange for someone to take you home.  PROCEDURE  This procedure usually takes about 15-30 minutes.  You will be given one of the following:  A medicine that numbs the area in and around the cervix (local anesthetic).   A medicine to make you sleep through the procedure (general anesthetic).  You will lie on your back with your legs in stirrups.   A warm metal or plastic instrument (speculum) will be placed in your vagina to keep it open and to allow the health care provider to see the cervix.  There are two ways in which your cervix can be softened and dilated. These include:   Taking a medicine.   Having thin rods (laminaria) inserted into your cervix.  A curved tool (curette) will be used to scrape cells from the inside lining of the uterus. In some cases, gentle suction is applied with the curette. The curette will then be removed.  AFTER THE PROCEDURE   You will rest in the recovery area until you are stable and are ready to go home.   You may feel sick to your stomach (nauseous) or throw up (vomit) if you were given a general anesthetic.   You may have a  sore throat if a tube was placed in your throat during general anesthesia.   You may have light cramping and bleeding. This may last for 2 days to 2 weeks after the procedure.   Your uterus needs to make a new lining after the procedure. This may make your next period late.   This information is not intended to replace advice given to you by your health care provider. Make sure you discuss any questions you have with your health care provider.   Document Released: 08/10/2005 Document Revised: 04/12/2013 Document Reviewed: 03/09/2013 Elsevier Interactive Patient Education 2016 [employer]  Intrauterine Device Information (IUD) An intrauterine device (IUD) is inserted into your uterus to prevent pregnancy. There are two types of IUDs available:   Copper IUD--This type of IUD is wrapped in copper wire and is placed inside the uterus. Copper makes the uterus and fallopian tubes produce a fluid that kills sperm. The copper IUD can stay in place for 10 years.  Hormone IUD--This type of IUD contains the hormone progestin (synthetic progesterone). The hormone thickens the cervical mucus and prevents sperm from entering the uterus. It also thins the uterine lining to prevent implantation of a fertilized egg. The hormone can weaken or kill the sperm that get into the uterus. One type of hormone IUD can stay in place for 5 years, and another type can stay in place for 3 years. Your health care provider will make sure you are a good candidate for a contraceptive IUD. Discuss with your health care provider the possible side effects.  ADVANTAGES OF AN INTRAUTERINE DEVICE  IUDs are highly effective, reversible, long acting, and low maintenance.   There are no estrogen-related side effects.   An IUD can be used when breastfeeding.   IUDs are not associated with weight gain.   The copper IUD works immediately after insertion.   The hormone IUD works right away if inserted within 7 days of your  period starting. You will need to use a backup method of birth control for 7 days if the hormone IUD is inserted at any other time in your cycle.  The copper IUD does not interfere with your female hormones.   The hormone IUD can make heavy menstrual periods lighter and decrease cramping.   The hormone IUD can be used for 3 or 5 years.   The copper IUD can be used for 10 years. DISADVANTAGES OF AN INTRAUTERINE DEVICE  The hormone IUD can be associated with irregular bleeding patterns.   The copper IUD can make your menstrual flow heavier and more painful.   You may experience cramping and vaginal bleeding after insertion.    This information is not intended to replace advice given to you by your health care provider. Make sure you discuss any questions you have with your health care provider.   Document Released: 07/14/2004 Document Revised: 04/12/2013 Document Reviewed: 01/29/2013 Elsevier Interactive Patient Education [employer]2016 Elsevier Inc.

## 2016-04-28 ENCOUNTER — Telehealth: Payer: Self-pay | Admitting: Gynecologic Oncology

## 2016-04-28 NOTE — Telephone Encounter (Signed)
Left message for patient on her home number.  Called her mobile number and her mother answered the phone.  Informed her that Joanna Evans's insurance is refusing to cover the IUD.  She states she will have her daughter give me a call.

## 2016-04-28 NOTE — Progress Notes (Signed)
Thank you for seeing [name]. I appreciate your expertise.

## 2016-05-01 ENCOUNTER — Ambulatory Visit: Payer: Self-pay | Admitting: Obstetrics and Gynecology

## 2016-05-05 ENCOUNTER — Telehealth: Payer: Self-pay | Admitting: Gynecologic Oncology

## 2016-05-05 NOTE — Telephone Encounter (Signed)
Spoke with the patient's mother.  She states the patient's phone "does not pick up well."  Informed the patient's mother that Dr. Duard BradyGehrig had also reached out to the insurance.  Options listed below per Dr. Duard BradyGehrig.  She states she will discuss with her daughter and have her call our office in one to two days to touch base.  Patient's mother states her son-in-law was told at work that the IUD should be covered.  Advised her that Dr. Duard BradyGehrig and myself had reached out to her insurance directly and her daughter could do the same to obtain further information.  After discussion with [facility] [facility]: 1) She could pay 800 out of pocket and then get reimbursed. She said that [facility] sometimes will give them to patient and bill directly or 2) I could speak with medical and see if [facility] ([facility] them in [facility]/OR and she would not need to bring it).

## 2016-05-14 ENCOUNTER — Encounter: Payer: Self-pay | Admitting: Gynecologic Oncology

## 2021-09-23 ENCOUNTER — Other Ambulatory Visit: Payer: Self-pay | Admitting: Otolaryngology

## 2021-09-24 ENCOUNTER — Other Ambulatory Visit: Payer: Self-pay | Admitting: Otolaryngology

## 2021-09-24 DIAGNOSIS — Z9989 Dependence on other enabling machines and devices: Secondary | ICD-10-CM

## 2021-09-24 DIAGNOSIS — R1314 Dysphagia, pharyngoesophageal phase: Secondary | ICD-10-CM

## 2021-09-24 DIAGNOSIS — K219 Gastro-esophageal reflux disease without esophagitis: Secondary | ICD-10-CM

## 2021-09-24 DIAGNOSIS — G4733 Obstructive sleep apnea (adult) (pediatric): Secondary | ICD-10-CM

## 2021-09-30 ENCOUNTER — Ambulatory Visit
Admission: RE | Admit: 2021-09-30 | Discharge: 2021-09-30 | Disposition: A | Discharge status: Home / Self Care | Payer: BC Managed Care – PPO | Source: Ambulatory Visit | Attending: Otolaryngology | Admitting: Otolaryngology

## 2021-09-30 DIAGNOSIS — R1314 Dysphagia, pharyngoesophageal phase: Secondary | ICD-10-CM

## 2021-09-30 DIAGNOSIS — Z6841 Body Mass Index (BMI) 40.0 and over, adult: Secondary | ICD-10-CM

## 2021-09-30 DIAGNOSIS — K219 Gastro-esophageal reflux disease without esophagitis: Secondary | ICD-10-CM

## 2021-09-30 DIAGNOSIS — G4733 Obstructive sleep apnea (adult) (pediatric): Secondary | ICD-10-CM

## 2022-06-10 IMAGING — RF DG ESOPHAGUS
10 series · 14 of 24 positions shown · non-contrast
Comparison: None.

CLINICAL DATA: Pharyngo esophageal dysphagia. Obstructive sleep
apnea.

EXAM:
ESOPHOGRAM / BARIUM SWALLOW / BARIUM TABLET STUDY
TECHNIQUE: Combined double contrast and single contrast examination performed
using effervescent crystals, thick barium liquid, and thin barium
liquid. The patient was observed with fluoroscopy swallowing a 13 mm
barium sulphate tablet.
FLUOROSCOPY:
Radiation Exposure Index (as provided by the fluoroscopic device):
35.7 mGy Kerma

[Series 1: sequence · 2 of 16 frames shown (1 of 6)]
[frame 3/16]
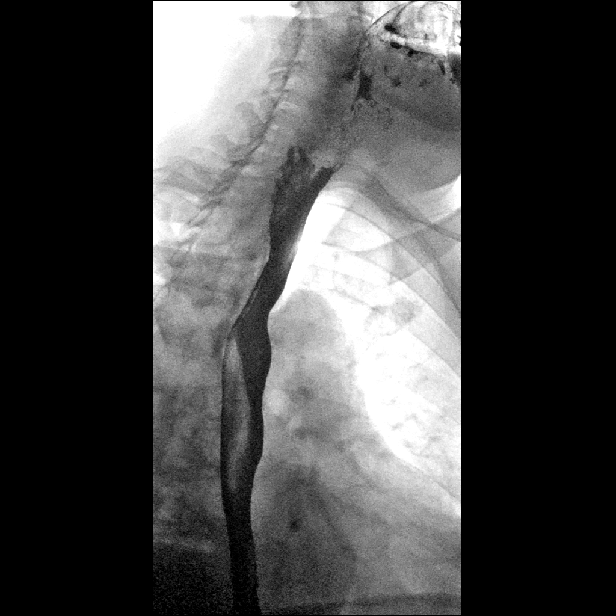
[frame 14/16]
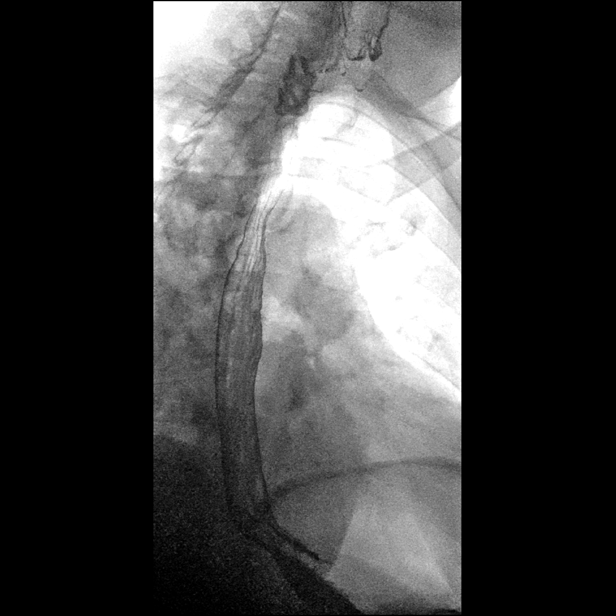

[Series 2: one shot · 1 of 2 slices shown (1 of 4)]
[im 2/2]
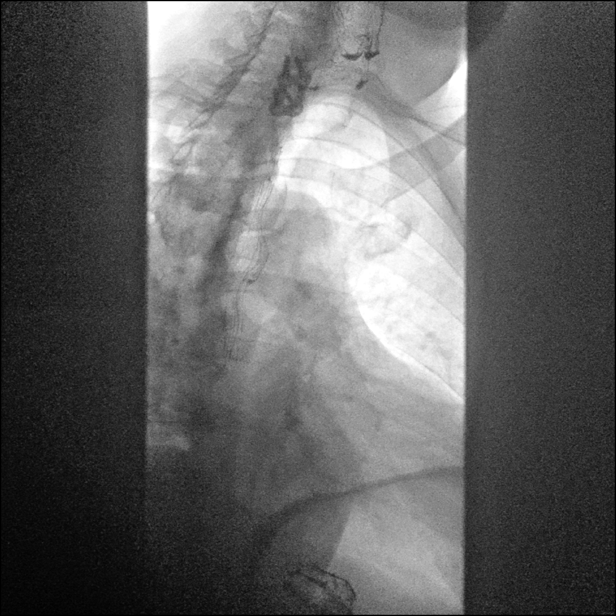

[Series 3: sequence · 2 of 17 frames shown (2 of 6)]
[frame 9/17]
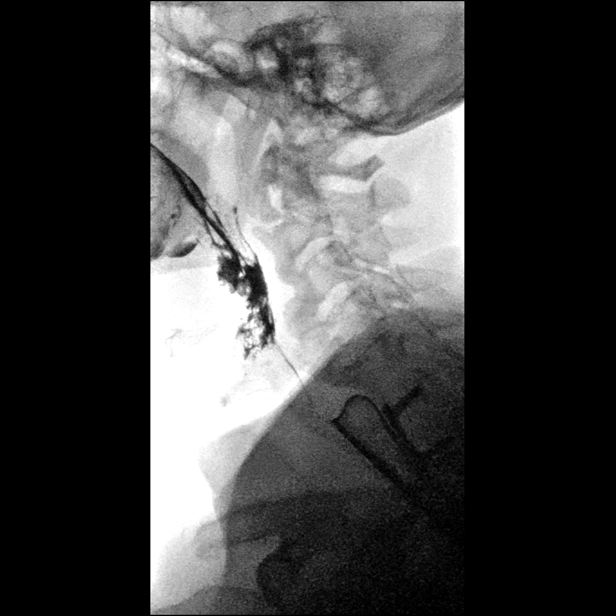
[frame 15/17]
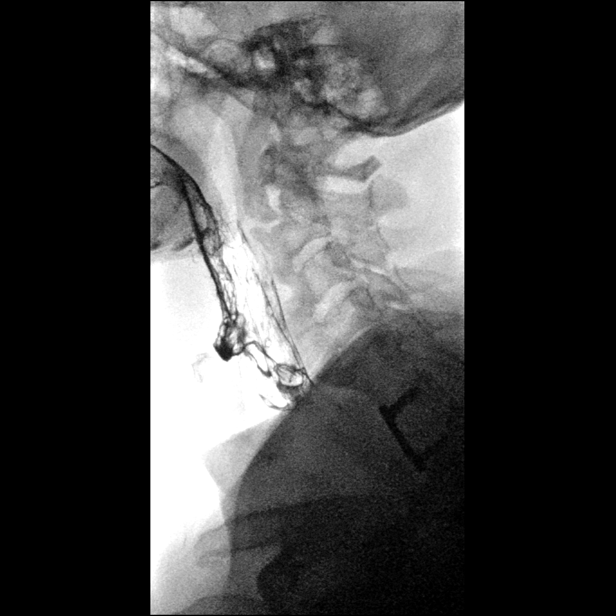

[Series 4: sequence · 1 of 21 frames shown (3 of 6)]
[frame 5/21]
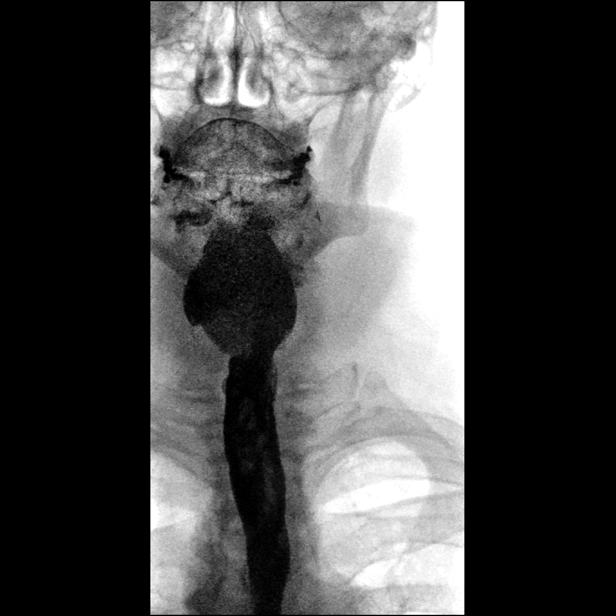

[Series 5: sequence · 2 of 50 frames shown (4 of 6)]
[frame 8/50]
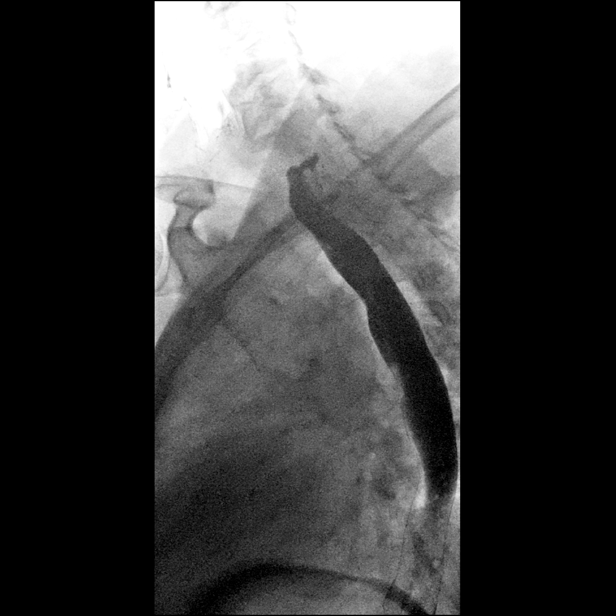
[frame 26/50]
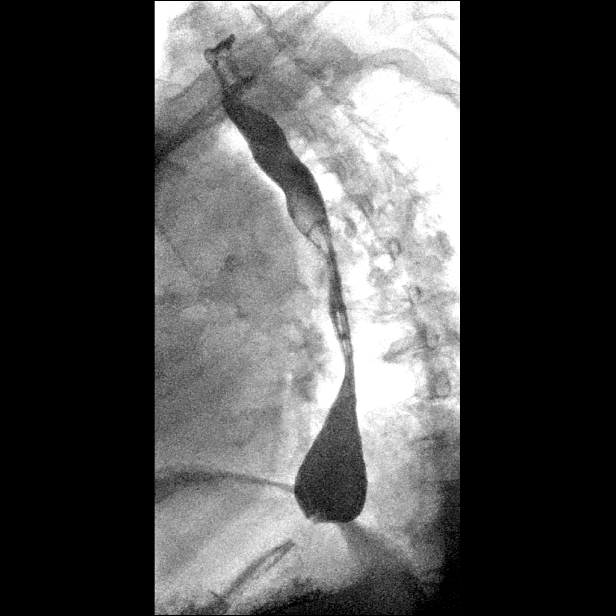

[Series 6: one shot · 2 of 3 slices shown (2 of 4)]
[im 1/3]
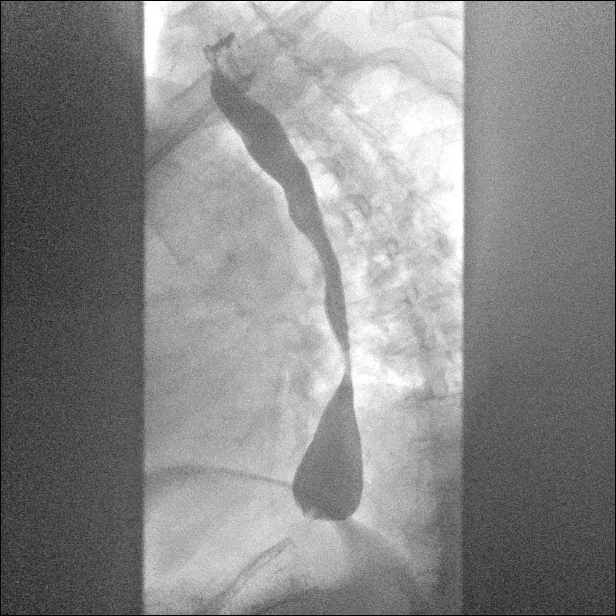
[im 3/3]
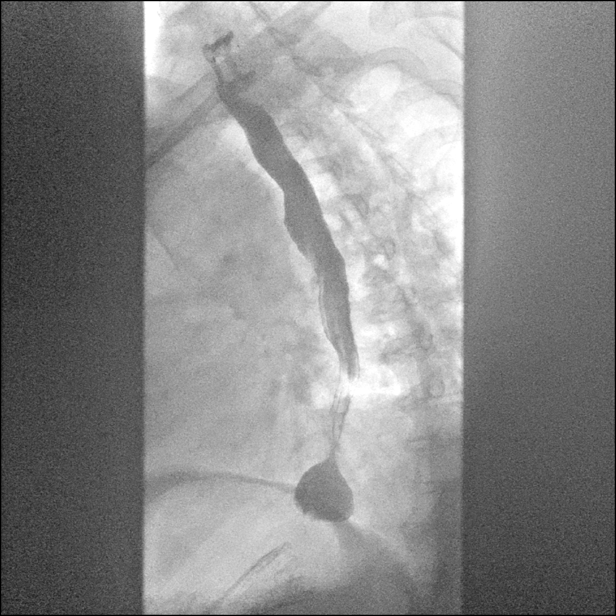

[Series 7: sequence · 1 of 35 frames shown (5 of 6)]
[frame 30/35]
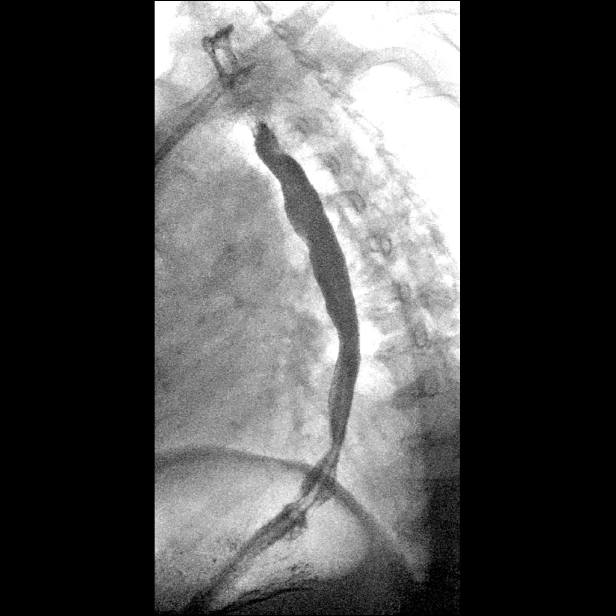

[Series 8: one shot · 1 of 1 slices shown (3 of 4)]
[im 1/1]
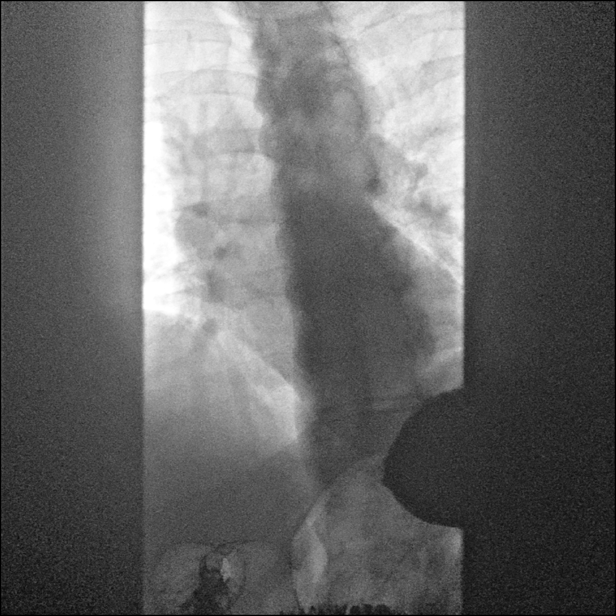

[Series 9: sequence · 1 of 7 frames shown (6 of 6)]
[frame 3/7]
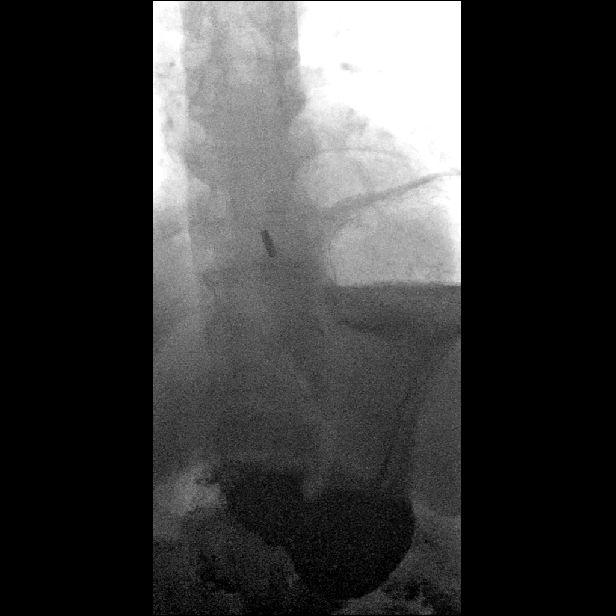

[Series 10: one shot · 1 of 1 slices shown (4 of 4)]
[im 1/1]
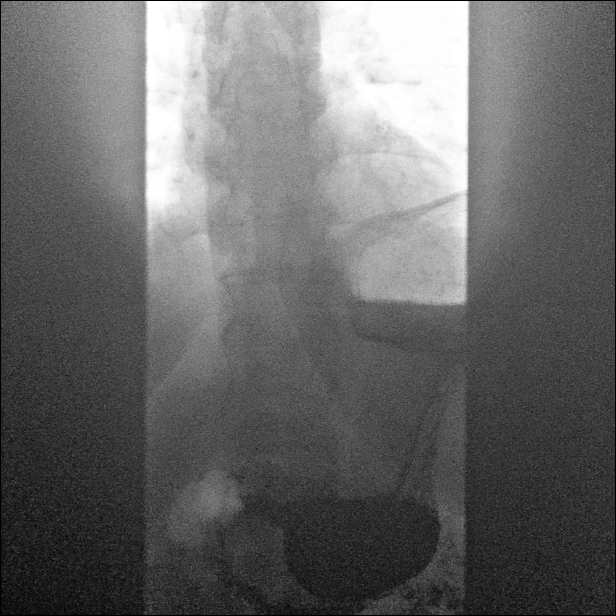

[14 of 24 positions shown; findings below may reference images not displayed]

FINDINGS: The patient swallowed the barium without difficulty. Rapid sequence
imaging of the pharynx in the AP and lateral projections
demonstrates no laryngeal penetration or mucosal abnormalities.
There is mild posterior impression on the cervical esophagus by
prominent anterior osteophytes from C2 through C5. Previous C6-7
ACDF.

Mild esophageal dysmotility with mild tertiary contractions. There
is no evidence of esophageal mass, stricture or ulceration. No
significant hiatal hernia. No gastroesophageal reflux was elicited
with the water siphon test.

At the conclusion of the study, a 13 mm barium tablet was
administered. This passed without delay into the stomach.
IMPRESSION: 1. No definite abnormality or explanation for this patient's
symptoms identified.
2. There is mild posterior impression on the cervical esophagus by
prominent anterior osteophytes at C2-5 consistent with diffuse
idiopathic skeletal hyperostosis.
3. Mild esophageal dysmotility. No evidence of esophageal stricture
or reflux.
